# Patient Record
Sex: Female | Born: 1985 | Hispanic: No | Marital: Married | Smoking: Never smoker
Health system: Southern US, Community
[De-identification: ages and names within clinical notes are randomized; demographics above are authoritative.]

## PROBLEM LIST (undated history)

## (undated) DIAGNOSIS — E039 Hypothyroidism, unspecified: Secondary | ICD-10-CM

## (undated) DIAGNOSIS — E282 Polycystic ovarian syndrome: Secondary | ICD-10-CM

## (undated) DIAGNOSIS — N979 Female infertility, unspecified: Secondary | ICD-10-CM

## (undated) DIAGNOSIS — Z789 Other specified health status: Secondary | ICD-10-CM

## (undated) HISTORY — DX: Polycystic ovarian syndrome: E28.2

## (undated) HISTORY — PX: NO PAST SURGERIES: SHX2092

## (undated) HISTORY — DX: Other specified health status: Z78.9

---

## 2015-05-15 LAB — OB RESULTS CONSOLE HIV ANTIBODY (ROUTINE TESTING)
HIV: NONREACTIVE
HIV: NONREACTIVE

## 2015-05-15 LAB — OB RESULTS CONSOLE ANTIBODY SCREEN
ANTIBODY SCREEN: NEGATIVE
ANTIBODY SCREEN: NEGATIVE

## 2015-05-15 LAB — OB RESULTS CONSOLE GC/CHLAMYDIA
CHLAMYDIA, DNA PROBE: NEGATIVE
Chlamydia: NEGATIVE
GC PROBE AMP, GENITAL: NEGATIVE
Gonorrhea: NEGATIVE

## 2015-05-15 LAB — OB RESULTS CONSOLE ABO/RH
RH TYPE: POSITIVE
RH Type: POSITIVE

## 2015-05-15 LAB — OB RESULTS CONSOLE RUBELLA ANTIBODY, IGM
RUBELLA: IMMUNE
RUBELLA: IMMUNE

## 2015-05-15 LAB — OB RESULTS CONSOLE HEPATITIS B SURFACE ANTIGEN
HEP B S AG: NEGATIVE
Hepatitis B Surface Ag: NEGATIVE

## 2015-05-15 LAB — OB RESULTS CONSOLE RPR
RPR: NONREACTIVE
RPR: NONREACTIVE

## 2015-10-02 ENCOUNTER — Other Ambulatory Visit (HOSPITAL_COMMUNITY): Payer: Self-pay | Admitting: Obstetrics and Gynecology

## 2015-10-02 DIAGNOSIS — O35EXX0 Maternal care for other (suspected) fetal abnormality and damage, fetal genitourinary anomalies, not applicable or unspecified: Secondary | ICD-10-CM

## 2015-10-02 DIAGNOSIS — O358XX0 Maternal care for other (suspected) fetal abnormality and damage, not applicable or unspecified: Secondary | ICD-10-CM

## 2015-10-03 ENCOUNTER — Other Ambulatory Visit (HOSPITAL_COMMUNITY): Payer: Self-pay | Admitting: Obstetrics and Gynecology

## 2015-10-03 DIAGNOSIS — O358XX0 Maternal care for other (suspected) fetal abnormality and damage, not applicable or unspecified: Secondary | ICD-10-CM

## 2015-10-03 DIAGNOSIS — Z3A29 29 weeks gestation of pregnancy: Secondary | ICD-10-CM

## 2015-10-03 DIAGNOSIS — Z3689 Encounter for other specified antenatal screening: Secondary | ICD-10-CM

## 2015-10-03 DIAGNOSIS — O35EXX0 Maternal care for other (suspected) fetal abnormality and damage, fetal genitourinary anomalies, not applicable or unspecified: Secondary | ICD-10-CM

## 2015-10-04 ENCOUNTER — Encounter (HOSPITAL_COMMUNITY): Payer: Self-pay

## 2015-10-04 ENCOUNTER — Ambulatory Visit (HOSPITAL_COMMUNITY)
Admission: RE | Admit: 2015-10-04 | Discharge: 2015-10-04 | Disposition: A | Discharge status: Home / Self Care | Payer: 59 | Source: Ambulatory Visit | Attending: Obstetrics and Gynecology | Admitting: Obstetrics and Gynecology

## 2015-10-04 DIAGNOSIS — Z36 Encounter for antenatal screening of mother: Secondary | ICD-10-CM | POA: Insufficient documentation

## 2015-10-04 DIAGNOSIS — O358XX0 Maternal care for other (suspected) fetal abnormality and damage, not applicable or unspecified: Secondary | ICD-10-CM | POA: Insufficient documentation

## 2015-10-04 DIAGNOSIS — Z3689 Encounter for other specified antenatal screening: Secondary | ICD-10-CM

## 2015-10-04 DIAGNOSIS — O35EXX0 Maternal care for other (suspected) fetal abnormality and damage, fetal genitourinary anomalies, not applicable or unspecified: Secondary | ICD-10-CM

## 2015-10-04 DIAGNOSIS — Z3A29 29 weeks gestation of pregnancy: Secondary | ICD-10-CM | POA: Insufficient documentation

## 2015-10-04 HISTORY — DX: Hypothyroidism, unspecified: E03.9

## 2015-10-18 ENCOUNTER — Ambulatory Visit (HOSPITAL_COMMUNITY): Payer: 59

## 2015-10-19 ENCOUNTER — Ambulatory Visit (HOSPITAL_COMMUNITY)
Admission: RE | Admit: 2015-10-19 | Discharge: 2015-10-19 | Disposition: A | Discharge status: Home / Self Care | Payer: 59 | Source: Ambulatory Visit | Attending: Obstetrics and Gynecology | Admitting: Obstetrics and Gynecology

## 2015-10-19 ENCOUNTER — Encounter (HOSPITAL_COMMUNITY): Payer: Self-pay

## 2015-10-19 DIAGNOSIS — Z3A31 31 weeks gestation of pregnancy: Secondary | ICD-10-CM | POA: Diagnosis not present

## 2015-10-19 DIAGNOSIS — O358XX0 Maternal care for other (suspected) fetal abnormality and damage, not applicable or unspecified: Secondary | ICD-10-CM | POA: Insufficient documentation

## 2015-10-19 DIAGNOSIS — O35EXX0 Maternal care for other (suspected) fetal abnormality and damage, fetal genitourinary anomalies, not applicable or unspecified: Secondary | ICD-10-CM

## 2015-10-29 ENCOUNTER — Other Ambulatory Visit (HOSPITAL_COMMUNITY): Payer: Self-pay

## 2015-10-29 ENCOUNTER — Encounter (HOSPITAL_COMMUNITY): Payer: Self-pay

## 2015-11-01 ENCOUNTER — Other Ambulatory Visit (HOSPITAL_COMMUNITY): Payer: Self-pay | Admitting: Maternal and Fetal Medicine

## 2015-11-01 ENCOUNTER — Ambulatory Visit (HOSPITAL_COMMUNITY)
Admission: RE | Admit: 2015-11-01 | Discharge: 2015-11-01 | Disposition: A | Discharge status: Home / Self Care | Payer: 59 | Source: Ambulatory Visit | Attending: Obstetrics and Gynecology | Admitting: Obstetrics and Gynecology

## 2015-11-01 ENCOUNTER — Encounter (HOSPITAL_COMMUNITY): Payer: Self-pay

## 2015-11-01 DIAGNOSIS — Z3A33 33 weeks gestation of pregnancy: Secondary | ICD-10-CM

## 2015-11-01 DIAGNOSIS — O35EXX0 Maternal care for other (suspected) fetal abnormality and damage, fetal genitourinary anomalies, not applicable or unspecified: Secondary | ICD-10-CM

## 2015-11-01 DIAGNOSIS — O283 Abnormal ultrasonic finding on antenatal screening of mother: Secondary | ICD-10-CM | POA: Diagnosis not present

## 2015-11-01 DIAGNOSIS — O358XX0 Maternal care for other (suspected) fetal abnormality and damage, not applicable or unspecified: Secondary | ICD-10-CM | POA: Diagnosis not present

## 2015-11-01 DIAGNOSIS — O359XX0 Maternal care for (suspected) fetal abnormality and damage, unspecified, not applicable or unspecified: Secondary | ICD-10-CM

## 2015-11-19 ENCOUNTER — Encounter (HOSPITAL_COMMUNITY): Payer: Self-pay

## 2015-11-19 ENCOUNTER — Ambulatory Visit (HOSPITAL_COMMUNITY)
Admission: RE | Admit: 2015-11-19 | Discharge: 2015-11-19 | Disposition: A | Discharge status: Home / Self Care | Payer: 59 | Source: Ambulatory Visit | Attending: Obstetrics and Gynecology | Admitting: Obstetrics and Gynecology

## 2015-11-19 ENCOUNTER — Other Ambulatory Visit (HOSPITAL_COMMUNITY): Payer: Self-pay | Admitting: Maternal and Fetal Medicine

## 2015-11-19 DIAGNOSIS — O35EXX0 Maternal care for other (suspected) fetal abnormality and damage, fetal genitourinary anomalies, not applicable or unspecified: Secondary | ICD-10-CM

## 2015-11-19 DIAGNOSIS — Z3A36 36 weeks gestation of pregnancy: Secondary | ICD-10-CM | POA: Diagnosis not present

## 2015-11-19 DIAGNOSIS — O359XX0 Maternal care for (suspected) fetal abnormality and damage, unspecified, not applicable or unspecified: Secondary | ICD-10-CM

## 2015-11-19 DIAGNOSIS — O358XX0 Maternal care for other (suspected) fetal abnormality and damage, not applicable or unspecified: Secondary | ICD-10-CM | POA: Diagnosis not present

## 2015-11-30 LAB — OB RESULTS CONSOLE GBS: GBS: NEGATIVE

## 2015-12-13 ENCOUNTER — Telehealth (HOSPITAL_COMMUNITY): Payer: Self-pay | Admitting: *Deleted

## 2015-12-13 NOTE — Telephone Encounter (Signed)
Preadmission screen  

## 2015-12-14 ENCOUNTER — Encounter (HOSPITAL_COMMUNITY): Payer: Self-pay | Admitting: *Deleted

## 2015-12-14 ENCOUNTER — Telehealth (HOSPITAL_COMMUNITY): Payer: Self-pay | Admitting: *Deleted

## 2015-12-14 NOTE — Telephone Encounter (Signed)
Preadmission screen  

## 2015-12-17 ENCOUNTER — Inpatient Hospital Stay (HOSPITAL_COMMUNITY): Payer: 59 | Admitting: Anesthesiology

## 2015-12-17 ENCOUNTER — Encounter (HOSPITAL_COMMUNITY)
Admission: AD | Disposition: A | Discharge status: Home / Self Care | Payer: Self-pay | Source: Ambulatory Visit | Attending: Obstetrics and Gynecology

## 2015-12-17 ENCOUNTER — Inpatient Hospital Stay (HOSPITAL_COMMUNITY)
Admission: AD | Admit: 2015-12-17 | Discharge: 2015-12-20 | DRG: 766 | Disposition: A | Discharge status: Home / Self Care | Payer: 59 | Source: Ambulatory Visit | Attending: Obstetrics and Gynecology | Admitting: Obstetrics and Gynecology

## 2015-12-17 ENCOUNTER — Encounter (HOSPITAL_COMMUNITY): Payer: Self-pay | Admitting: *Deleted

## 2015-12-17 DIAGNOSIS — O99284 Endocrine, nutritional and metabolic diseases complicating childbirth: Secondary | ICD-10-CM | POA: Diagnosis present

## 2015-12-17 DIAGNOSIS — E282 Polycystic ovarian syndrome: Secondary | ICD-10-CM | POA: Diagnosis present

## 2015-12-17 DIAGNOSIS — E038 Other specified hypothyroidism: Secondary | ICD-10-CM | POA: Diagnosis present

## 2015-12-17 DIAGNOSIS — Z8249 Family history of ischemic heart disease and other diseases of the circulatory system: Secondary | ICD-10-CM

## 2015-12-17 DIAGNOSIS — Z3A40 40 weeks gestation of pregnancy: Secondary | ICD-10-CM

## 2015-12-17 DIAGNOSIS — IMO0001 Reserved for inherently not codable concepts without codable children: Secondary | ICD-10-CM

## 2015-12-17 DIAGNOSIS — Z98891 History of uterine scar from previous surgery: Secondary | ICD-10-CM

## 2015-12-17 DIAGNOSIS — O324XX0 Maternal care for high head at term, not applicable or unspecified: Secondary | ICD-10-CM | POA: Diagnosis present

## 2015-12-17 HISTORY — DX: Female infertility, unspecified: N97.9

## 2015-12-17 LAB — CBC
HCT: 35 % — ABNORMAL LOW (ref 36.0–46.0)
HEMOGLOBIN: 12 g/dL (ref 12.0–15.0)
MCH: 28 pg (ref 26.0–34.0)
MCHC: 34.3 g/dL (ref 30.0–36.0)
MCV: 81.6 fL (ref 78.0–100.0)
Platelets: 183 10*3/uL (ref 150–400)
RBC: 4.29 MIL/uL (ref 3.87–5.11)
RDW: 15.1 % (ref 11.5–15.5)
WBC: 10.9 10*3/uL — AB (ref 4.0–10.5)

## 2015-12-17 LAB — TYPE AND SCREEN
ABO/RH(D): B POS
ANTIBODY SCREEN: NEGATIVE

## 2015-12-17 LAB — ABO/RH: ABO/RH(D): B POS

## 2015-12-17 SURGERY — Surgical Case
Anesthesia: Epidural | Site: Abdomen

## 2015-12-17 MED ORDER — LIDOCAINE HCL (PF) 1 % IJ SOLN
INTRAMUSCULAR | Status: DC | PRN
  Administered 2015-12-17: 8 mL via EPIDURAL
  Administered 2015-12-17: 6 mL via EPIDURAL

## 2015-12-17 MED ORDER — PHENYLEPHRINE HCL 10 MG/ML IJ SOLN
INTRAMUSCULAR | Status: DC | PRN
  Administered 2015-12-17 (×2): 40 ug via INTRAVENOUS
  Administered 2015-12-17: 120 ug via INTRAVENOUS
  Administered 2015-12-17 (×2): 40 ug via INTRAVENOUS
  Administered 2015-12-17: 80 ug via INTRAVENOUS
  Administered 2015-12-17: 40 ug via INTRAVENOUS

## 2015-12-17 MED ORDER — ONDANSETRON HCL 4 MG/2ML IJ SOLN
INTRAMUSCULAR | Status: AC
Start: 2015-12-17 — End: 2015-12-17
  Filled 2015-12-17: qty 2

## 2015-12-17 MED ORDER — SIMETHICONE 80 MG PO CHEW
80.0000 mg | CHEWABLE_TABLET | Freq: Three times a day (TID) | ORAL | Status: DC
  Administered 2015-12-18 – 2015-12-20 (×6): 80 mg via ORAL
  Filled 2015-12-17 (×6): qty 1

## 2015-12-17 MED ORDER — SODIUM CHLORIDE 0.9% FLUSH
INTRAVENOUS | Status: AC
  Filled 2015-12-17: qty 6

## 2015-12-17 MED ORDER — FENTANYL 2.5 MCG/ML BUPIVACAINE 1/10 % EPIDURAL INFUSION (WH - ANES)
14.0000 mL/h | INTRAMUSCULAR | Status: DC | PRN
  Administered 2015-12-17: 14 mL/h via EPIDURAL
  Filled 2015-12-17: qty 125

## 2015-12-17 MED ORDER — DIPHENHYDRAMINE HCL 50 MG/ML IJ SOLN
12.5000 mg | INTRAMUSCULAR | Status: DC | PRN
Start: 2015-12-17 — End: 2015-12-20

## 2015-12-17 MED ORDER — PHENYLEPHRINE 40 MCG/ML (10ML) SYRINGE FOR IV PUSH (FOR BLOOD PRESSURE SUPPORT): 80.0000 ug | PREFILLED_SYRINGE | INTRAVENOUS | Status: DC | PRN

## 2015-12-17 MED ORDER — LIDOCAINE-EPINEPHRINE (PF) 2 %-1:200000 IJ SOLN
INTRAMUSCULAR | Status: AC
  Filled 2015-12-17: qty 20

## 2015-12-17 MED ORDER — FENTANYL CITRATE (PF) 100 MCG/2ML IJ SOLN: 25.0000 ug | INTRAMUSCULAR | Status: DC | PRN

## 2015-12-17 MED ORDER — MEPERIDINE HCL 25 MG/ML IJ SOLN
INTRAMUSCULAR | Status: DC | PRN
  Administered 2015-12-17: 12.5 mg via INTRAVENOUS

## 2015-12-17 MED ORDER — LACTATED RINGERS IV SOLN: 500.0000 mL | INTRAVENOUS | Status: DC | PRN

## 2015-12-17 MED ORDER — DIPHENHYDRAMINE HCL 25 MG PO CAPS: 25.0000 mg | ORAL_CAPSULE | Freq: Four times a day (QID) | ORAL | Status: DC | PRN

## 2015-12-17 MED ORDER — FENTANYL CITRATE (PF) 100 MCG/2ML IJ SOLN
INTRAMUSCULAR | Status: DC | PRN
  Administered 2015-12-17: 100 ug via INTRATHECAL

## 2015-12-17 MED ORDER — PHENYLEPHRINE 40 MCG/ML (10ML) SYRINGE FOR IV PUSH (FOR BLOOD PRESSURE SUPPORT)
PREFILLED_SYRINGE | INTRAVENOUS | Status: AC
  Filled 2015-12-17: qty 20

## 2015-12-17 MED ORDER — LACTATED RINGERS IV SOLN
INTRAVENOUS | Status: DC
  Administered 2015-12-17 (×2): via INTRAVENOUS

## 2015-12-17 MED ORDER — SCOPOLAMINE 1 MG/3DAYS TD PT72
1.0000 | MEDICATED_PATCH | Freq: Once | TRANSDERMAL | Status: DC
  Filled 2015-12-17: qty 1

## 2015-12-17 MED ORDER — MEPERIDINE HCL 25 MG/ML IJ SOLN: 6.2500 mg | INTRAMUSCULAR | Status: DC | PRN

## 2015-12-17 MED ORDER — CEFAZOLIN SODIUM-DEXTROSE 2-4 GM/100ML-% IV SOLN
2.0000 g | INTRAVENOUS | Status: AC
  Administered 2015-12-17: 2 g via INTRAVENOUS
  Filled 2015-12-17: qty 100

## 2015-12-17 MED ORDER — SODIUM CHLORIDE 0.9 % IR SOLN
Status: DC | PRN
Start: 2015-12-17 — End: 2015-12-17
  Administered 2015-12-17: 1000 mL

## 2015-12-17 MED ORDER — OXYTOCIN 10 UNIT/ML IJ SOLN: 2.5000 [IU]/h | INTRAMUSCULAR | Status: DC

## 2015-12-17 MED ORDER — SODIUM BICARBONATE 8.4 % IV SOLN
INTRAVENOUS | Status: DC | PRN
  Administered 2015-12-17 (×3): 5 mL via EPIDURAL

## 2015-12-17 MED ORDER — CEFAZOLIN (ANCEF) 1 G IV SOLR: 2.0000 g | INTRAVENOUS | Status: DC

## 2015-12-17 MED ORDER — LACTATED RINGERS IV SOLN: 500.0000 mL | Freq: Once | INTRAVENOUS | Status: DC

## 2015-12-17 MED ORDER — ACETAMINOPHEN 325 MG PO TABS: 650.0000 mg | ORAL_TABLET | ORAL | Status: DC | PRN

## 2015-12-17 MED ORDER — LACTATED RINGERS IV SOLN
INTRAVENOUS | Status: DC | PRN
  Administered 2015-12-17: 19:00:00 via INTRAVENOUS

## 2015-12-17 MED ORDER — MORPHINE SULFATE (PF) 0.5 MG/ML IJ SOLN
INTRAMUSCULAR | Status: DC | PRN
  Administered 2015-12-17: 4 mg via EPIDURAL

## 2015-12-17 MED ORDER — MEPERIDINE HCL 25 MG/ML IJ SOLN
INTRAMUSCULAR | Status: AC
  Filled 2015-12-17: qty 1

## 2015-12-17 MED ORDER — SODIUM CHLORIDE 0.9% FLUSH: 3.0000 mL | INTRAVENOUS | Status: DC | PRN

## 2015-12-17 MED ORDER — OXYTOCIN 10 UNIT/ML IJ SOLN
40.0000 [IU] | INTRAVENOUS | Status: DC | PRN
  Administered 2015-12-17: 40 [IU] via INTRAVENOUS

## 2015-12-17 MED ORDER — EPHEDRINE 5 MG/ML INJ
10.0000 mg | INTRAVENOUS | Status: DC | PRN
Start: 2015-12-17 — End: 2015-12-17

## 2015-12-17 MED ORDER — SENNOSIDES-DOCUSATE SODIUM 8.6-50 MG PO TABS
2.0000 | ORAL_TABLET | ORAL | Status: DC
  Administered 2015-12-17 – 2015-12-19 (×3): 2 via ORAL
  Filled 2015-12-17 (×3): qty 2

## 2015-12-17 MED ORDER — OXYCODONE-ACETAMINOPHEN 5-325 MG PO TABS: 2.0000 | ORAL_TABLET | ORAL | Status: DC | PRN

## 2015-12-17 MED ORDER — NALBUPHINE HCL 10 MG/ML IJ SOLN: 5.0000 mg | Freq: Once | INTRAMUSCULAR | Status: DC | PRN

## 2015-12-17 MED ORDER — ONDANSETRON HCL 4 MG/2ML IJ SOLN
INTRAMUSCULAR | Status: DC | PRN
  Administered 2015-12-17: 4 mg via INTRAVENOUS

## 2015-12-17 MED ORDER — ONDANSETRON HCL 4 MG/2ML IJ SOLN: 4.0000 mg | Freq: Once | INTRAMUSCULAR | Status: DC | PRN

## 2015-12-17 MED ORDER — DIBUCAINE 1 % RE OINT: 1.0000 "application " | TOPICAL_OINTMENT | RECTAL | Status: DC | PRN

## 2015-12-17 MED ORDER — SIMETHICONE 80 MG PO CHEW: 80.0000 mg | CHEWABLE_TABLET | ORAL | Status: DC | PRN

## 2015-12-17 MED ORDER — MORPHINE SULFATE (PF) 0.5 MG/ML IJ SOLN
INTRAMUSCULAR | Status: AC
  Filled 2015-12-17: qty 10

## 2015-12-17 MED ORDER — EPHEDRINE 5 MG/ML INJ: 10.0000 mg | INTRAVENOUS | Status: DC | PRN

## 2015-12-17 MED ORDER — OXYTOCIN BOLUS FROM INFUSION: 500.0000 mL | INTRAVENOUS | Status: DC

## 2015-12-17 MED ORDER — MENTHOL 3 MG MT LOZG: 1.0000 | LOZENGE | OROMUCOSAL | Status: DC | PRN

## 2015-12-17 MED ORDER — SCOPOLAMINE 1 MG/3DAYS TD PT72
MEDICATED_PATCH | TRANSDERMAL | Status: DC | PRN
  Administered 2015-12-17: 1 via TRANSDERMAL

## 2015-12-17 MED ORDER — MEASLES, MUMPS & RUBELLA VAC ~~LOC~~ INJ: 0.5000 mL | INJECTION | Freq: Once | SUBCUTANEOUS | Status: DC

## 2015-12-17 MED ORDER — NALOXONE HCL 0.4 MG/ML IJ SOLN: 0.4000 mg | INTRAMUSCULAR | Status: DC | PRN

## 2015-12-17 MED ORDER — SODIUM BICARBONATE 8.4 % IV SOLN
INTRAVENOUS | Status: AC
  Filled 2015-12-17: qty 50

## 2015-12-17 MED ORDER — DEXTROSE 5 % IV SOLN
1.0000 ug/kg/h | INTRAVENOUS | Status: DC | PRN
Start: 2015-12-17 — End: 2015-12-20

## 2015-12-17 MED ORDER — OXYCODONE-ACETAMINOPHEN 5-325 MG PO TABS
2.0000 | ORAL_TABLET | ORAL | Status: DC | PRN
  Administered 2015-12-18 – 2015-12-19 (×5): 2 via ORAL
  Filled 2015-12-17 (×5): qty 2

## 2015-12-17 MED ORDER — TETANUS-DIPHTH-ACELL PERTUSSIS 5-2.5-18.5 LF-MCG/0.5 IM SUSP: 0.5000 mL | Freq: Once | INTRAMUSCULAR | Status: DC

## 2015-12-17 MED ORDER — DIPHENHYDRAMINE HCL 50 MG/ML IJ SOLN: 12.5000 mg | INTRAMUSCULAR | Status: DC | PRN

## 2015-12-17 MED ORDER — KETOROLAC TROMETHAMINE 30 MG/ML IJ SOLN
30.0000 mg | Freq: Four times a day (QID) | INTRAMUSCULAR | Status: AC | PRN
  Administered 2015-12-17: 30 mg via INTRAVENOUS

## 2015-12-17 MED ORDER — DEXTROSE IN LACTATED RINGERS 5 % IV SOLN: INTRAVENOUS | Status: DC

## 2015-12-17 MED ORDER — ONDANSETRON HCL 4 MG/2ML IJ SOLN: 4.0000 mg | Freq: Three times a day (TID) | INTRAMUSCULAR | Status: DC | PRN

## 2015-12-17 MED ORDER — IBUPROFEN 600 MG PO TABS
600.0000 mg | ORAL_TABLET | Freq: Four times a day (QID) | ORAL | Status: DC
  Administered 2015-12-18 – 2015-12-20 (×9): 600 mg via ORAL
  Filled 2015-12-17 (×9): qty 1

## 2015-12-17 MED ORDER — SIMETHICONE 80 MG PO CHEW
80.0000 mg | CHEWABLE_TABLET | ORAL | Status: DC
  Administered 2015-12-17 – 2015-12-19 (×3): 80 mg via ORAL
  Filled 2015-12-17 (×3): qty 1

## 2015-12-17 MED ORDER — ZOLPIDEM TARTRATE 5 MG PO TABS: 5.0000 mg | ORAL_TABLET | Freq: Every evening | ORAL | Status: DC | PRN

## 2015-12-17 MED ORDER — PRENATAL MULTIVITAMIN CH
1.0000 | ORAL_TABLET | Freq: Every day | ORAL | Status: DC
  Administered 2015-12-18 – 2015-12-20 (×3): 1 via ORAL
  Filled 2015-12-17 (×3): qty 1

## 2015-12-17 MED ORDER — FENTANYL CITRATE (PF) 100 MCG/2ML IJ SOLN
INTRAMUSCULAR | Status: AC
  Filled 2015-12-17: qty 2

## 2015-12-17 MED ORDER — OXYCODONE-ACETAMINOPHEN 5-325 MG PO TABS
1.0000 | ORAL_TABLET | ORAL | Status: DC | PRN
  Administered 2015-12-18: 1 via ORAL
  Filled 2015-12-17: qty 1

## 2015-12-17 MED ORDER — LIDOCAINE HCL (PF) 1 % IJ SOLN: 30.0000 mL | INTRAMUSCULAR | Status: DC | PRN

## 2015-12-17 MED ORDER — OXYCODONE-ACETAMINOPHEN 5-325 MG PO TABS
1.0000 | ORAL_TABLET | ORAL | Status: DC | PRN
Start: 2015-12-17 — End: 2015-12-17

## 2015-12-17 MED ORDER — NALBUPHINE HCL 10 MG/ML IJ SOLN: 5.0000 mg | INTRAMUSCULAR | Status: DC | PRN

## 2015-12-17 MED ORDER — ONDANSETRON HCL 4 MG/2ML IJ SOLN: 4.0000 mg | Freq: Four times a day (QID) | INTRAMUSCULAR | Status: DC | PRN

## 2015-12-17 MED ORDER — PHENYLEPHRINE 40 MCG/ML (10ML) SYRINGE FOR IV PUSH (FOR BLOOD PRESSURE SUPPORT)
80.0000 ug | PREFILLED_SYRINGE | INTRAVENOUS | Status: DC | PRN
  Filled 2015-12-17: qty 20

## 2015-12-17 MED ORDER — MEDROXYPROGESTERONE ACETATE 150 MG/ML IM SUSP: 150.0000 mg | INTRAMUSCULAR | Status: DC | PRN

## 2015-12-17 MED ORDER — KETOROLAC TROMETHAMINE 30 MG/ML IJ SOLN
INTRAMUSCULAR | Status: AC
  Administered 2015-12-17: 30 mg via INTRAVENOUS
  Filled 2015-12-17: qty 1

## 2015-12-17 MED ORDER — OXYTOCIN 10 UNIT/ML IJ SOLN: 2.5000 [IU]/h | INTRAVENOUS | Status: DC

## 2015-12-17 MED ORDER — ACETAMINOPHEN 500 MG PO TABS
1000.0000 mg | ORAL_TABLET | Freq: Four times a day (QID) | ORAL | Status: AC
  Administered 2015-12-17 – 2015-12-18 (×3): 1000 mg via ORAL
  Filled 2015-12-17 (×3): qty 2

## 2015-12-17 MED ORDER — KETOROLAC TROMETHAMINE 30 MG/ML IJ SOLN: 30.0000 mg | Freq: Four times a day (QID) | INTRAMUSCULAR | Status: AC | PRN

## 2015-12-17 MED ORDER — OXYTOCIN 10 UNIT/ML IJ SOLN
INTRAMUSCULAR | Status: AC
  Filled 2015-12-17: qty 4

## 2015-12-17 MED ORDER — CITRIC ACID-SODIUM CITRATE 334-500 MG/5ML PO SOLN
30.0000 mL | ORAL | Status: DC | PRN
  Administered 2015-12-17: 30 mL via ORAL
  Filled 2015-12-17: qty 15

## 2015-12-17 MED ORDER — SCOPOLAMINE 1 MG/3DAYS TD PT72
MEDICATED_PATCH | TRANSDERMAL | Status: AC
  Filled 2015-12-17: qty 1

## 2015-12-17 MED ORDER — LEVOTHYROXINE SODIUM 100 MCG PO TABS
100.0000 ug | ORAL_TABLET | Freq: Every day | ORAL | Status: DC
  Administered 2015-12-18: 100 ug via ORAL
  Filled 2015-12-17: qty 1

## 2015-12-17 MED ORDER — DIPHENHYDRAMINE HCL 25 MG PO CAPS: 25.0000 mg | ORAL_CAPSULE | ORAL | Status: DC | PRN

## 2015-12-17 MED ORDER — WITCH HAZEL-GLYCERIN EX PADS: 1.0000 "application " | MEDICATED_PAD | CUTANEOUS | Status: DC | PRN

## 2015-12-17 MED ORDER — LANOLIN HYDROUS EX OINT: 1.0000 "application " | TOPICAL_OINTMENT | CUTANEOUS | Status: DC | PRN

## 2015-12-17 SURGICAL SUPPLY — 29 items
CHLORAPREP W/TINT 26ML (MISCELLANEOUS) ×3 IMPLANT
CLAMP CORD UMBIL (MISCELLANEOUS) IMPLANT
CLOTH BEACON ORANGE TIMEOUT ST (SAFETY) ×3 IMPLANT
DRSG OPSITE POSTOP 4X10 (GAUZE/BANDAGES/DRESSINGS) ×3 IMPLANT
ELECT REM PT RETURN 9FT ADLT (ELECTROSURGICAL) ×3
ELECTRODE REM PT RTRN 9FT ADLT (ELECTROSURGICAL) ×1 IMPLANT
EXTRACTOR VACUUM M CUP 4 TUBE (SUCTIONS) IMPLANT
EXTRACTOR VACUUM M CUP 4' TUBE (SUCTIONS)
GLOVE BIO SURGEON STRL SZ 6.5 (GLOVE) ×2 IMPLANT
GLOVE BIO SURGEONS STRL SZ 6.5 (GLOVE) ×1
GLOVE BIOGEL PI IND STRL 7.0 (GLOVE) ×2 IMPLANT
GLOVE BIOGEL PI INDICATOR 7.0 (GLOVE) ×4
GOWN STRL REUS W/TWL LRG LVL3 (GOWN DISPOSABLE) ×6 IMPLANT
KIT ABG SYR 3ML LUER SLIP (SYRINGE) IMPLANT
LIQUID BAND (GAUZE/BANDAGES/DRESSINGS) ×3 IMPLANT
NEEDLE HYPO 25X5/8 SAFETYGLIDE (NEEDLE) IMPLANT
NS IRRIG 1000ML POUR BTL (IV SOLUTION) ×3 IMPLANT
PACK C SECTION WH (CUSTOM PROCEDURE TRAY) ×3 IMPLANT
PAD OB MATERNITY 4.3X12.25 (PERSONAL CARE ITEMS) ×3 IMPLANT
PENCIL SMOKE EVAC W/HOLSTER (ELECTROSURGICAL) ×3 IMPLANT
SUT CHROMIC 0 CT 802H (SUTURE) IMPLANT
SUT CHROMIC 0 CTX 36 (SUTURE) ×9 IMPLANT
SUT MON AB-0 CT1 36 (SUTURE) ×3 IMPLANT
SUT PDS AB 0 CTX 60 (SUTURE) ×3 IMPLANT
SUT PLAIN 0 NONE (SUTURE) IMPLANT
SUT VIC AB 4-0 KS 27 (SUTURE) IMPLANT
SYR BULB 3OZ (MISCELLANEOUS) ×3 IMPLANT
TOWEL OR 17X24 6PK STRL BLUE (TOWEL DISPOSABLE) ×3 IMPLANT
TRAY FOLEY CATH SILVER 14FR (SET/KITS/TRAYS/PACK) IMPLANT

## 2015-12-17 NOTE — Op Note (Signed)
Cesarean Section Procedure Note   Carrie Fox  12/17/2015  Indications: arrest of descent   Pre-operative Diagnosis: Arrest of descent, Primary cesarean section.   Post-operative Diagnosis: Same   Surgeon: Surgeon(s) and Role:    * Zelphia CairoGretchen Alice Vitelli, MD - Primary   Assistants: none  Anesthesia: epidural   Procedure Details:  The patient was seen in the Holding Room. The risks, benefits, complications, treatment options, and expected outcomes were discussed with the patient. The patient concurred with the proposed plan, giving informed consent. identified as Carrie DanielSlesha Bergeson and the procedure verified as C-Section Delivery. A Time Out was held and the above information confirmed.  After induction of anesthesia, the patient was draped and prepped in the usual sterile manner. A transverse was made and carried down through the subcutaneous tissue to the fascia. Fascial incision was made and extended transversely. The fascia was separated from the underlying rectus tissue superiorly and inferiorly. The peritoneum was identified and entered. Peritoneal incision was extended longitudinally. The utero-vesical peritoneal reflection was incised transversely and the bladder flap was bluntly freed from the lower uterine segment. A low transverse uterine incision was made. Delivered from cephalic presentation was a vigorous female with Apgar scores of 8 at one minute and 9 at five minutes. Cord ph was not sent the umbilical cord was clamped and cut cord blood was obtained for evaluation. The placenta was removed Intact and appeared normal. The uterine outline, tubes and ovaries appeared normal}. The uterine incision was closed with running locked sutures of 0chromic gut.   Hemostasis was observed. Lavage was carried out until clear. Peritoneum closed with 0 monocryl. The fascia was then reapproximated with running sutures of 0PDS.  The skin was closed with 4-0Vicryl.   Instrument, sponge, and needle counts  were correct prior the abdominal closure and were correct at the conclusion of the case.     Estimated Blood Loss: 600cc   Urine Output: clear  Specimens: placenta  Complications: no complications  Disposition: PACU - hemodynamically stable.   Maternal Condition: stable   Baby condition / location:  Couplet care / Skin to Skin  Attending Attestation: I was present and scrubbed for the entire procedure.   Signed: Surgeon(s): Zelphia CairoGretchen Shannelle Alguire, MD

## 2015-12-17 NOTE — Progress Notes (Signed)
Pt pushing w/ good effort. Very little descent.   FHT reassuring Toco Q1-2 Cvx +1 station + caput  A/P Will keep pushing and reassess in If no progress - will consider primary c-section. Plan of care d/w patient & husband

## 2015-12-17 NOTE — MAU Note (Signed)
Contractions since 0630. Noticed pinkish mucous D/C. No leakage of fluid.

## 2015-12-17 NOTE — H&P (Signed)
Carrie Fox is a 30 y.o. female G1 @ 40wks presenting for SOL.  No lof or vb.  Good FM.  History OB History    Gravida Para Term Preterm AB TAB SAB Ectopic Multiple Living   1         0     Past Medical History  Diagnosis Date  . Hypothyroidism     t[place] medication from [place] [employer] [place] increased meds with +UPT, not evaluated since then  . Vegetarian     eats eggs and dairy  . PCOS (polycystic ovarian syndrome)   . Infertility, female    Past Surgical History  Procedure Laterality Date  . No past surgeries     Family History: family history includes Hypertension in her father. Social History:  reports that she has never smoked. She has never used smokeless tobacco. She reports that she does not drink alcohol or use illicit drugs.   Prenatal Transfer Tool  Maternal Diabetes: No Genetic Screening: Normal Maternal Ultrasounds/Referrals: Normal Fetal Ultrasounds or other Referrals:  Other: bilateral renal anomoly Maternal Substance Abuse:  No Significant Maternal Medications: thyroid Significant Maternal Lab Results:  None Other Comments:  None  ROS  Dilation: 6 Effacement (%): 90 Station: -2 Exam by:: Carrie Fox, RNC Blood pressure 113/73, pulse 96, temperature 98 F (36.7 C), temperature source Oral, resp. rate 16, height 5\' 4"  (1.626 m), weight 193 lb (87.544 kg), last menstrual period 03/12/2015, SpO2 100 %. Exam Physical Exam  Gen - uncomfortable w/ ctx - now comfortable w/ epidural Abd - gravid, NT  EFW 8# Ext - NT, trace edema bilaterally Cvx 7.5/90/-1, vtx AROM, clear Prenatal labs: ABO, Rh: --/--/B POS (04/10 1107) Antibody: NEG (04/10 1107) Rubella: Immune, Immune (09/06 0000) RPR: Nonreactive, Nonreactive (09/06 0000)  HBsAg: Negative, Negative (09/06 0000)  HIV: Non-reactive, Non-reactive (09/06 0000)  GBS: Negative (03/24 0000)   Assessment/Plan: Admit Exp mngt   Carrie Fox 12/17/2015, 12:26 PM

## 2015-12-17 NOTE — Transfer of Care (Signed)
Immediate Anesthesia Transfer of Care Note  Patient: Carrie Fox  Procedure(s) Performed: Procedure(s): CESAREAN SECTION (N/A)  Patient Location: PACU  Anesthesia Type:Epidural  Level of Consciousness: awake, oriented and patient cooperative  Airway & Oxygen Therapy: Patient Spontanous Breathing  Post-op Assessment: Report given to RN and Post -op Vital signs reviewed and stable  Post vital signs: Reviewed and stable  Last Vitals:  Filed Vitals:   12/17/15 1602 12/17/15 1603  BP: 115/93 115/93  Pulse: 103 103  Temp:    Resp: 16 16    Complications: No apparent anesthesia complications

## 2015-12-17 NOTE — Anesthesia Postprocedure Evaluation (Signed)
Anesthesia Post Note  Patient: Carrie Fox  Procedure(s) Performed: Procedure(s) (LRB): CESAREAN SECTION (N/A)  Patient location during evaluation: PACU Anesthesia Type: Spinal Level of consciousness: awake and alert Pain management: pain level controlled Vital Signs Assessment: post-procedure vital signs reviewed and stable Respiratory status: spontaneous breathing, nonlabored ventilation, respiratory function stable and patient connected to nasal cannula oxygen Cardiovascular status: blood pressure returned to baseline and stable Postop Assessment: no signs of nausea or vomiting, patient able to bend at knees and epidural receding Anesthetic complications: no    Last Vitals:  Filed Vitals:   12/17/15 2030 12/17/15 2045  BP: 117/75 115/99  Pulse: 100 91  Temp:    Resp: 16 28    Last Pain:  Filed Vitals:   12/17/15 2054  PainSc: 1     LLE Motor Response: Purposeful movement (12/17/15 2045) LLE Sensation: Increased (12/17/15 2045) RLE Motor Response: Purposeful movement (12/17/15 2045) RLE Sensation: Increased (12/17/15 2045)      Avenly Roberge JENNETTE

## 2015-12-17 NOTE — Anesthesia Procedure Notes (Signed)
Epidural Patient location during procedure: OB Start time: 12/17/2015 11:31 AM End time: 12/17/2015 11:35 AM  Staffing Anesthesiologist: Leilani AbleHATCHETT, Nicolas Banh Performed by: anesthesiologist   Preanesthetic Checklist Completed: patient identified, surgical consent, pre-op evaluation, timeout performed, IV checked, risks and benefits discussed and monitors and equipment checked  Epidural Patient position: sitting Prep: site prepped and draped and DuraPrep Patient monitoring: continuous pulse ox and blood pressure Approach: midline Location: L3-L4 Injection technique: LOR air  Needle:  Needle type: Tuohy  Needle gauge: 17 G Needle length: 9 cm and 9 Needle insertion depth: 6 cm Catheter type: closed end flexible Catheter size: 19 Gauge Catheter at skin depth: 11 cm Test dose: negative and Other  Assessment Sensory level: T9 Events: blood not aspirated, injection not painful, no injection resistance, negative IV test and no paresthesia  Additional Notes Reason for block:procedure for pain

## 2015-12-17 NOTE — Anesthesia Preprocedure Evaluation (Signed)
Anesthesia Evaluation  Patient identified by MRN, date of birth, ID band Patient awake    Reviewed: Allergy & Precautions, H&P , Patient's Chart, lab work & pertinent test results  Airway Mallampati: I  TM Distance: >3 FB Neck ROM: full    Dental no notable dental hx.    Pulmonary neg pulmonary ROS,    Pulmonary exam normal        Cardiovascular negative cardio ROS Normal cardiovascular exam     Neuro/Psych negative neurological ROS  negative psych ROS   GI/Hepatic negative GI ROS, Neg liver ROS,   Endo/Other    Renal/GU negative Renal ROS     Musculoskeletal   Abdominal (+) + obese,   Peds  Hematology negative hematology ROS (+)   Anesthesia Other Findings   Reproductive/Obstetrics (+) Pregnancy                             Anesthesia Physical Anesthesia Plan  ASA: II  Anesthesia Plan: Epidural   Post-op Pain Management:    Induction:   Airway Management Planned:   Additional Equipment:   Intra-op Plan:   Post-operative Plan:   Informed Consent: I have reviewed the patients History and Physical, chart, labs and discussed the procedure including the risks, benefits and alternatives for the proposed anesthesia with the patient or authorized representative who has indicated his/her understanding and acceptance.     Plan Discussed with:   Anesthesia Plan Comments:         Anesthesia Quick Evaluation

## 2015-12-17 NOTE — Progress Notes (Signed)
Pt pushing x 30+ more minutes without further descent.  Rec primary c-section n- pt and husband agree R/b/a discussed, questions answered, informed consent

## 2015-12-18 ENCOUNTER — Encounter (HOSPITAL_COMMUNITY): Payer: Self-pay | Admitting: Obstetrics and Gynecology

## 2015-12-18 ENCOUNTER — Inpatient Hospital Stay (HOSPITAL_COMMUNITY): Payer: 59

## 2015-12-18 LAB — CBC
HCT: 29.7 % — ABNORMAL LOW (ref 36.0–46.0)
Hemoglobin: 9.8 g/dL — ABNORMAL LOW (ref 12.0–15.0)
MCH: 26.8 pg (ref 26.0–34.0)
MCHC: 33 g/dL (ref 30.0–36.0)
MCV: 81.4 fL (ref 78.0–100.0)
Platelets: 122 10*3/uL — ABNORMAL LOW (ref 150–400)
RBC: 3.65 MIL/uL — ABNORMAL LOW (ref 3.87–5.11)
RDW: 15.1 % (ref 11.5–15.5)
WBC: 7.4 10*3/uL (ref 4.0–10.5)

## 2015-12-18 LAB — CCBB MATERNAL DONOR DRAW

## 2015-12-18 LAB — RPR: RPR: NONREACTIVE

## 2015-12-18 MED ORDER — LACTATED RINGERS IV SOLN: INTRAVENOUS | Status: DC

## 2015-12-18 MED ORDER — LEVOTHYROXINE SODIUM 75 MCG PO TABS
75.0000 ug | ORAL_TABLET | Freq: Every day | ORAL | Status: DC
  Administered 2015-12-19 – 2015-12-20 (×2): 75 ug via ORAL
  Filled 2015-12-18 (×4): qty 1

## 2015-12-18 MED ORDER — FERROUS SULFATE 325 (65 FE) MG PO TABS
325.0000 mg | ORAL_TABLET | Freq: Two times a day (BID) | ORAL | Status: DC
  Administered 2015-12-18 – 2015-12-20 (×4): 325 mg via ORAL
  Filled 2015-12-18 (×4): qty 1

## 2015-12-18 NOTE — Anesthesia Postprocedure Evaluation (Signed)
Anesthesia Post Note  Patient: Carrie Fox  Procedure(s) Performed: Procedure(s) (LRB): CESAREAN SECTION (N/A)  Patient location during evaluation: Mother Baby Anesthesia Type: Epidural Level of consciousness: awake and alert Pain management: pain level controlled Vital Signs Assessment: post-procedure vital signs reviewed and stable Respiratory status: spontaneous breathing, nonlabored ventilation and respiratory function stable Cardiovascular status: stable Postop Assessment: no headache, no backache and epidural receding Anesthetic complications: no    Last Vitals:  Filed Vitals:   12/18/15 0057 12/18/15 0500  BP: 126/70 100/52  Pulse: 139 111  Temp:  36.6 C  Resp:  18    Last Pain:  Filed Vitals:   12/18/15 0508  PainSc: 0-No pain                 Krysteena Stalker

## 2015-12-18 NOTE — Lactation Note (Signed)
This note was copied from a baby's chart. Lactation Consultation Note New mom trying to BF in cradle position w/baby's face down. FOB saying baby can't breath this way. Encouraged mom to BF in football position d/t c-section. Assisted in football position, mom has small everted nipples w/compressible areola. Hand expression taught w/colostrum noted. Baby latching on and off frequently. Obtains deep latch at intervals, frequently needs chin tug. At times mom states burning, baby comes off w/nipple pinched. Baby is just on nipple. Mom can't assist in latching much w/opposite hand d/t IV she says. FOB is very involved. Demonstrated to him chin tug. Heard automatic swallows. Mom encouraged to feed baby 8-12 times/24 hours and with feeding cues. Mom reports + breast changes w/pregnancy. Has Dx: PCOS, had breast tissue and cone shaped breast w/space between breast. Encouraged to document I&O, do STS, discussed supply and demand. Referred to Baby and Me Book in Breastfeeding section Pg. 22-23 for position options and Proper latch demonstration.[facility]/LC brochure given w/resources, support groups and LC services.     Patient Name: Carrie Zigmund [name] QMVHQ'IToday's Date: 12/18/2015 Reason for consult: Initial assessment   Maternal Data Has patient been taught Hand Expression?: Yes  Feeding Feeding Type: Breast Fed Length of feed: 20 min  LATCH Score/Interventions Latch: Repeated attempts needed to sustain latch, nipple held in mouth throughout feeding, stimulation needed to elicit sucking reflex. Intervention(s): Adjust position;Assist with latch;Breast massage;Breast compression  Audible Swallowing: Spontaneous and intermittent Intervention(s): Skin to skin;Hand expression  Type of Nipple: Everted at rest and after stimulation  Comfort (Breast/Nipple): Soft / non-tender     Hold (Positioning): Full assist, staff holds infant at breast Intervention(s): Breastfeeding basics reviewed;Support  Pillows;Position options;Skin to skin  LATCH Score: 7  Lactation Tools Discussed/Used     Consult Status Consult Status: Follow-up Date: 12/18/15 Follow-up type: In-patient    Zehava Turski, Diamond NickelLAURA G 12/18/2015, 3:01 AM

## 2015-12-18 NOTE — Progress Notes (Signed)
Subjective: Postpartum Day 1: Cesarean Delivery Patient reports tolerating PO.    Objective: Vital signs in last 24 hours: Temp:  [97.8 F (36.6 C)-99.6 F (37.6 C)] 97.9 F (36.6 C) (04/11 0500) Pulse Rate:  [85-139] 111 (04/11 0500) Resp:  [15-30] 18 (04/11 0500) BP: (83-140)/(46-99) 100/52 mmHg (04/11 0500) SpO2:  [89 %-100 %] 97 % (04/11 0500) Weight:  [193 lb (87.544 kg)-206 lb (93.441 kg)] 193 lb (87.544 kg) (04/10 1036)  Physical Exam:  General: alert and cooperative Lochia: appropriate Uterine Fundus: firm Incision: healing well DVT Evaluation: No evidence of DVT seen on physical exam. Negative Homan's sign. No cords or calf tenderness. Calf/Ankle edema is present.   Recent Labs  12/17/15 1107 12/18/15 0543  HGB 12.0 9.8*  HCT 35.0* 29.7*    Assessment/Plan: Status post Cesarean section. Doing well postoperatively.  Continue current care.  CURTIS,CAROL G 12/18/2015, 8:02 AM

## 2015-12-18 NOTE — Addendum Note (Signed)
Addendum  created 12/18/15 0756 by Junious SilkMelinda Donnetta Gillin, CRNA   Modules edited: Charges VN, Clinical Notes   Clinical Notes:  File: [id number]

## 2015-12-18 NOTE — Lactation Note (Signed)
This note was copied from a baby's chart. Lactation Consultation Note  Patient Name: Boy Zigmund DanielSlesha Dieujuste ZOXWR'UToday's Date: 12/18/2015 Reason for consult: Follow-up assessment Baby at 27 hr of life and mom is worried about supply. She stated she is not making any milk on the R side and her nipple is red. She was able to manually express large drops of colostrum bilaterally. Her R nipple has a dark brown vertical compression stripe, L appears normal. Mom was unable to hold baby in the football position alone. She needs lots of encouragement and support. Baby was on the breast about 5 minutes when he came off gagging. Mom was very worried that he was "choking and can't breath", baby just spit up a little. It was pink but he had just gotten pink medication. Discussed baby behavior, feeding frequency, baby belly size, voids, wt loss, breast changes, and nipple care. Mom will put baby to breast 8+/24 hr and use her milk on her nipples after each feeding.      Maternal Data    Feeding Feeding Type: Breast Fed Length of feed: 5 min  LATCH Score/Interventions Latch: Repeated attempts needed to sustain latch, nipple held in mouth throughout feeding, stimulation needed to elicit sucking reflex. Intervention(s): Adjust position;Assist with latch;Breast compression  Audible Swallowing: None Intervention(s): Hand expression;Skin to skin  Type of Nipple: Everted at rest and after stimulation  Comfort (Breast/Nipple): Filling, red/small blisters or bruises, mild/mod discomfort  Problem noted: Mild/Moderate discomfort;Cracked, bleeding, blisters, bruises Interventions  (Cracked/bleeding/bruising/blister): Expressed breast milk to nipple Interventions (Mild/moderate discomfort): Comfort gels  Hold (Positioning): Full assist, staff holds infant at breast Intervention(s): Position options;Support Pillows  LATCH Score: 4  Lactation Tools Discussed/Used     Consult Status Consult Status:  Follow-up Date: 12/19/15 Follow-up type: In-patient    Rulon Eisenmengerlizabeth E Anise Harbin 12/18/2015, 9:53 PM

## 2015-12-19 NOTE — Progress Notes (Signed)
Subjective: Postpartum Day 2: Cesarean Delivery Patient reports incisional pain, tolerating PO, + BM and no problems voiding.    Objective: Vital signs in last 24 hours: Temp:  [97.6 F (36.4 C)-97.9 F (36.6 C)] 97.8 F (36.6 C) (04/12 0538) Pulse Rate:  [90-95] 90 (04/12 0538) Resp:  [18-22] 18 (04/12 0538) BP: (102-109)/(57-64) 109/57 mmHg (04/12 0538) SpO2:  [98 %-99 %] 99 % (04/11 1300)  Physical Exam:  General: alert and cooperative Lochia: appropriate Uterine Fundus: firm Incision: healing well DVT Evaluation: No evidence of DVT seen on physical exam. Negative Homan's sign. No cords or calf tenderness.   Recent Labs  12/17/15 1107 12/18/15 0543  HGB 12.0 9.8*  HCT 35.0* 29.7*    Assessment/Plan: Status post Cesarean section. Doing well postoperatively.  Continue current care.  CURTIS,CAROL G 12/19/2015, 8:22 AM  Agree with above. Patient declines circ.   Mitchel HonourMegan Quency Tober, DO

## 2015-12-19 NOTE — Lactation Note (Signed)
This note was copied from a baby's chart. Lactation Consultation Note  Patient Name: Carrie Fox Reason for consult: Follow-up assessment Mom had just finished bf baby upon entry. She is reporting more nipple pain today. She has compression stripes bilaterally. She is using the comfort gels and her milk. Described getting a deeper latch. Encouraged mom to call for help at next feeding. She is aware of OP services and support group. Offered an OP apt but she declined. She stated that she will be too busy with all the baby's other MD apt to have an apt for herself.    Maternal Data    Feeding Feeding Type: Breast Fed Length of feed: 20 min  LATCH Score/Interventions Latch: Grasps breast easily, tongue down, lips flanged, rhythmical sucking. Intervention(s): Adjust position;Assist with latch;Breast compression  Audible Swallowing: Spontaneous and intermittent Intervention(s): Skin to skin Intervention(s): Skin to skin;Hand expression  Type of Nipple: Everted at rest and after stimulation  Comfort (Breast/Nipple): Filling, red/small blisters or bruises, mild/mod discomfort  Problem noted: Mild/Moderate discomfort Interventions  (Cracked/bleeding/bruising/blister): Double electric pump;Expressed breast milk to nipple Interventions (Mild/moderate discomfort): Comfort gels;Post-pump  Hold (Positioning): Assistance needed to correctly position infant at breast and maintain latch. Intervention(s): Breastfeeding basics reviewed;Position options  LATCH Score: 8  Lactation Tools Discussed/Used     Consult Status Consult Status: Follow-up Date: 12/20/15 Follow-up type: In-patient    Rulon Eisenmengerlizabeth E Guenther [place] Fox, 11:01 PM

## 2015-12-19 NOTE — Addendum Note (Signed)
Addendum  created 12/19/15 0906 by Sherrian DiversBruce Wonder Donaway, MD   Modules edited: Anesthesia Responsible Staff

## 2015-12-20 MED ORDER — LEVOTHYROXINE SODIUM 75 MCG PO TABS: 75.0000 ug | ORAL_TABLET | Freq: Every day | ORAL | Status: AC

## 2015-12-20 MED ORDER — OXYCODONE-ACETAMINOPHEN 5-325 MG PO TABS: 1.0000 | ORAL_TABLET | ORAL | Status: AC | PRN

## 2015-12-20 MED ORDER — IBUPROFEN 600 MG PO TABS: 600.0000 mg | ORAL_TABLET | Freq: Four times a day (QID) | ORAL | Status: AC

## 2015-12-20 NOTE — Lactation Note (Signed)
This note was copied from a baby's chart. Lactation Consultation Note  Patient Name: Boy Zigmund DanielSlesha Procida ZOXWR'UToday's Date: 12/20/2015 Reason for consult: Follow-up assessment;Other (Comment);Infant weight loss (10 % weight loss , per mom Pedis MD mentioned to her to either do extra pumping and supplement bakc or use formula for supplement ) per mom will borrow a pump from a friend . when asked if it is a DEBP , per mom wasn;t sure. LC recommended calling the friend  to find out. if not rental option available until her DEBP comes in from  insurance. important due to 10 % weight loss.  Per mom the Pedis doctor gave her 2 options this am. - 1st option - to post pump and if able to express milk feed EBM back to baby. Option #2 - supplement with formula after every feeding.  LC was able to hand express  Easily , and encouraged mom to increase her post pumping after offering both breast.  Sore nipple and engorgement prevention and tx reviewed. Referring to the BABY AND ME booklet pages 24 - 25.  LC stressed to mom skin to skin feedings until weight is increasing back to birth weight, and watch for non - nutritive feeding patterns. Offer both breast and then supplement , post pump both breast , save milk.  Per mom [facility] .  Mother informed of post-discharge support and given phone number to the lactation department, including services for phone call assistance; out-patient appointments; and breastfeeding support group. List of other breastfeeding resources in the community given in the handout. Encouraged mother to call for problems or concerns related to breastfeeding.  Maternal Data Has patient been taught Hand Expression?: Yes (reviewed and noted a steady flow, breast warmer , and pe rmom having some nipple senistivity )  Feeding Feeding Type: Bottle Fed - Formula Nipple Type: Slow - flow Length of feed: 10 min (multiply swallows noted , increased with  breast compressions)  LATCH Score/Interventions                Intervention(s): Breastfeeding basics reviewed     Lactation Tools Discussed/Used WIC Program: No Pump Review: Setup, frequency, and cleaning (showed mom also how to use hand pump )   Consult Status Consult Status: Follow-up Date: 12/20/15 Follow-up type: In-patient    Kathrin Greathouseorio, Illa Enlow Ann 12/20/2015, 12:00 PM

## 2015-12-20 NOTE — Discharge Summary (Signed)
Obstetric Discharge Summary Reason for Admission: induction of labor Prenatal Procedures: ultrasound Intrapartum Procedures: cesarean: low cervical, transverse Postpartum Procedures: none Complications-Operative and Postpartum: none HEMOGLOBIN  Date Value Ref Range Status  12/18/2015 9.8* 12.0 - 15.0 Fox/dL Final   HCT  Date Value Ref Range Status  12/18/2015 29.7* 36.0 - 46.0 % Final    Physical Exam:  General: alert and cooperative Lochia: appropriate Uterine Fundus: firm Incision: healing well DVT Evaluation: No evidence of DVT seen on physical exam. Negative Homan's sign. No cords or calf tenderness. No significant calf/ankle edema.  Discharge Diagnoses: Term Pregnancy-delivered  Discharge Information: Date: 12/20/2015 Activity: pelvic rest Diet: routine Medications: PNV, Ibuprofen, Iron, Percocet and synthroid Condition: stable Instructions: refer to practice specific booklet Discharge to: home   Newborn Data: Live born female  Birth Weight: 8 lb 5.7 oz (3790 Fox) APGAR: 8, 9  Home with mother.  Carrie Fox 12/20/2015, 8:20 AM

## 2015-12-20 NOTE — Lactation Note (Signed)
This note was copied from a baby's chart. Lactation Consultation Note  Patient Name: Carrie Fox WUJWJ'XToday's Date: 12/20/2015  [facility]BP , instructions given , obtained , and receipt given.  Dad aware the location to return the DEBP at 14 days.     Maternal Data    Feeding Feeding Type: Breast Fed Length of feed: 30 min  LATCH Score/Interventions Latch: Grasps breast easily, tongue down, lips flanged, rhythmical sucking. Intervention(s): Adjust position;Assist with latch  Audible Swallowing: Spontaneous and intermittent Intervention(s): Skin to skin;Hand expression Intervention(s): Hand expression  Type of Nipple: Everted at rest and after stimulation  Comfort (Breast/Nipple): Filling, red/small blisters or bruises, mild/mod discomfort  Problem noted: Mild/Moderate discomfort Interventions  (Cracked/bleeding/bruising/blister): Hand pump;Double electric pump Interventions (Mild/moderate discomfort): Hand expression  Hold (Positioning): No assistance needed to correctly position infant at breast. Intervention(s): Breastfeeding basics reviewed;Support Pillows;Position options;Skin to skin  LATCH Score: 9  Lactation Tools Discussed/Used     Consult Status      Kathrin Greathouseorio, Carrie Fox 12/20/2015, 4:39 PM

## 2015-12-21 ENCOUNTER — Inpatient Hospital Stay (HOSPITAL_COMMUNITY): Admission: RE | Admit: 2015-12-21 | Payer: 59 | Source: Ambulatory Visit
